# Patient Record
Sex: Male | Born: 2000 | Race: Black or African American | Hispanic: No | Marital: Single | Smoking: Never smoker
Health system: Southern US, Community
[De-identification: ages and names within clinical notes are randomized; demographics above are authoritative.]

---
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
## 2021-01-16 ENCOUNTER — Other Ambulatory Visit: Payer: Self-pay

## 2021-01-16 ENCOUNTER — Ambulatory Visit (HOSPITAL_COMMUNITY)
Admission: EM | Admit: 2021-01-16 | Discharge: 2021-01-16 | Disposition: A | Discharge status: Home / Self Care | Payer: Self-pay | Attending: Family Medicine | Admitting: Family Medicine

## 2021-01-16 ENCOUNTER — Ambulatory Visit (INDEPENDENT_AMBULATORY_CARE_PROVIDER_SITE_OTHER): Payer: Self-pay

## 2021-01-16 ENCOUNTER — Encounter (HOSPITAL_COMMUNITY): Payer: Self-pay

## 2021-01-16 DIAGNOSIS — M542 Cervicalgia: Secondary | ICD-10-CM

## 2021-01-16 DIAGNOSIS — M25561 Pain in right knee: Secondary | ICD-10-CM

## 2021-01-16 DIAGNOSIS — S161XXA Strain of muscle, fascia and tendon at neck level, initial encounter: Secondary | ICD-10-CM

## 2021-01-16 MED ORDER — MELOXICAM 15 MG PO TABS: 15.0000 mg | ORAL_TABLET | Freq: Every day | ORAL | 0 refills | Status: AC

## 2021-01-16 MED ORDER — CYCLOBENZAPRINE HCL 10 MG PO TABS: 10.0000 mg | ORAL_TABLET | Freq: Two times a day (BID) | ORAL | 0 refills | Status: AC | PRN
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
## 2021-01-16 NOTE — Discharge Instructions (Addendum)
Take meloxicam 1 tablet daily for inflammation and achiness.  For acute pain or muscle spasms I prescribed you cyclobenzaprine which you can take up to twice daily.  Medication can cause severe drowsiness therefore avoid taking while driving.
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
## 2024-01-15 ENCOUNTER — Other Ambulatory Visit: Payer: Self-pay

## 2024-01-15 ENCOUNTER — Encounter (HOSPITAL_COMMUNITY): Payer: Self-pay | Admitting: Emergency Medicine

## 2024-01-15 ENCOUNTER — Emergency Department (HOSPITAL_COMMUNITY)
Admission: EM | Admit: 2024-01-15 | Discharge: 2024-01-15 | Disposition: A | Discharge status: Home / Self Care | Payer: No Typology Code available for payment source | Attending: Emergency Medicine | Admitting: Emergency Medicine

## 2024-01-15 DIAGNOSIS — S060XAA Concussion with loss of consciousness status unknown, initial encounter: Secondary | ICD-10-CM | POA: Insufficient documentation

## 2024-01-15 DIAGNOSIS — Y92410 Unspecified street and highway as the place of occurrence of the external cause: Secondary | ICD-10-CM | POA: Insufficient documentation

## 2024-01-15 DIAGNOSIS — S0990XA Unspecified injury of head, initial encounter: Secondary | ICD-10-CM | POA: Diagnosis present
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
